# Patient Record
Sex: Male | Born: 1960 | Race: Black or African American | Hispanic: No | Marital: Single | Smoking: Current every day smoker
Health system: Southern US, Community
[De-identification: ages and names within clinical notes are randomized; demographics above are authoritative.]

---

## 2005-01-03 ENCOUNTER — Emergency Department (HOSPITAL_COMMUNITY): Admission: EM | Admit: 2005-01-03 | Discharge: 2005-01-03 | Payer: Self-pay | Admitting: Emergency Medicine

## 2008-09-09 ENCOUNTER — Emergency Department (HOSPITAL_COMMUNITY): Admission: EM | Admit: 2008-09-09 | Discharge: 2008-09-09 | Payer: Self-pay | Admitting: Emergency Medicine

## 2008-09-19 ENCOUNTER — Emergency Department (HOSPITAL_COMMUNITY): Admission: EM | Admit: 2008-09-19 | Discharge: 2008-09-19 | Payer: Self-pay | Admitting: *Deleted

## 2009-05-13 ENCOUNTER — Emergency Department (HOSPITAL_BASED_OUTPATIENT_CLINIC_OR_DEPARTMENT_OTHER): Admission: EM | Admit: 2009-05-13 | Discharge: 2009-05-13 | Payer: Self-pay | Admitting: Emergency Medicine

## 2009-05-21 ENCOUNTER — Ambulatory Visit: Payer: Self-pay | Admitting: Cardiovascular Disease

## 2009-05-22 ENCOUNTER — Inpatient Hospital Stay (HOSPITAL_COMMUNITY): Admission: EM | Admit: 2009-05-22 | Discharge: 2009-05-22 | Payer: Self-pay | Admitting: Emergency Medicine

## 2009-05-22 ENCOUNTER — Encounter (INDEPENDENT_AMBULATORY_CARE_PROVIDER_SITE_OTHER): Payer: Self-pay | Admitting: Internal Medicine

## 2009-06-25 ENCOUNTER — Emergency Department (HOSPITAL_COMMUNITY): Admission: EM | Admit: 2009-06-25 | Discharge: 2009-06-25 | Payer: Self-pay | Admitting: Emergency Medicine

## 2009-07-26 ENCOUNTER — Emergency Department (HOSPITAL_COMMUNITY): Admission: EM | Admit: 2009-07-26 | Discharge: 2009-07-26 | Payer: Self-pay | Admitting: Emergency Medicine

## 2009-07-29 ENCOUNTER — Emergency Department (HOSPITAL_COMMUNITY): Admission: EM | Admit: 2009-07-29 | Discharge: 2009-07-29 | Payer: Self-pay | Admitting: Emergency Medicine

## 2010-09-22 LAB — CBC
HCT: 48.8 % (ref 39.0–52.0)
MCV: 87.6 fL (ref 78.0–100.0)
Platelets: 225 10*3/uL (ref 150–400)
RDW: 12.8 % (ref 11.5–15.5)
WBC: 12.8 10*3/uL — ABNORMAL HIGH (ref 4.0–10.5)

## 2010-09-22 LAB — RAPID URINE DRUG SCREEN, HOSP PERFORMED
Amphetamines: NOT DETECTED
Barbiturates: NOT DETECTED
Benzodiazepines: NOT DETECTED
Cocaine: NOT DETECTED

## 2010-09-22 LAB — GLUCOSE, CAPILLARY: Glucose-Capillary: 98 mg/dL (ref 70–99)

## 2010-09-22 LAB — DIFFERENTIAL
Basophils Absolute: 0.1 10*3/uL (ref 0.0–0.1)
Basophils Relative: 1 % (ref 0–1)
Eosinophils Absolute: 0.3 10*3/uL (ref 0.0–0.7)
Eosinophils Relative: 3 % (ref 0–5)
Lymphs Abs: 3.3 10*3/uL (ref 0.7–4.0)
Neutrophils Relative %: 66 % (ref 43–77)

## 2010-09-22 LAB — POCT I-STAT, CHEM 8
Calcium, Ion: 1.15 mmol/L (ref 1.12–1.32)
Creatinine, Ser: 1 mg/dL (ref 0.4–1.5)
Glucose, Bld: 101 mg/dL — ABNORMAL HIGH (ref 70–99)
HCT: 52 % (ref 39.0–52.0)
Hemoglobin: 17.7 g/dL — ABNORMAL HIGH (ref 13.0–17.0)
Potassium: 3.4 mEq/L — ABNORMAL LOW (ref 3.5–5.1)
TCO2: 29 mmol/L (ref 0–100)

## 2010-09-22 LAB — CK TOTAL AND CKMB (NOT AT ARMC)
CK, MB: 7 ng/mL — ABNORMAL HIGH (ref 0.3–4.0)
Relative Index: 1 (ref 0.0–2.5)

## 2010-09-22 LAB — TROPONIN I: Troponin I: 0.01 ng/mL (ref 0.00–0.06)

## 2010-09-22 LAB — CARDIAC PANEL(CRET KIN+CKTOT+MB+TROPI)
CK, MB: 5 ng/mL — ABNORMAL HIGH (ref 0.3–4.0)
Total CK: 642 U/L — ABNORMAL HIGH (ref 7–232)
Troponin I: 0.02 ng/mL (ref 0.00–0.06)

## 2016-04-19 ENCOUNTER — Emergency Department (HOSPITAL_BASED_OUTPATIENT_CLINIC_OR_DEPARTMENT_OTHER)
Admission: EM | Admit: 2016-04-19 | Discharge: 2016-04-19 | Disposition: A | Discharge status: Home / Self Care | Payer: Self-pay | Attending: Emergency Medicine | Admitting: Emergency Medicine

## 2016-04-19 ENCOUNTER — Emergency Department (HOSPITAL_BASED_OUTPATIENT_CLINIC_OR_DEPARTMENT_OTHER): Payer: Self-pay

## 2016-04-19 ENCOUNTER — Encounter (HOSPITAL_BASED_OUTPATIENT_CLINIC_OR_DEPARTMENT_OTHER): Payer: Self-pay | Admitting: *Deleted

## 2016-04-19 DIAGNOSIS — N3001 Acute cystitis with hematuria: Secondary | ICD-10-CM | POA: Insufficient documentation

## 2016-04-19 DIAGNOSIS — R935 Abnormal findings on diagnostic imaging of other abdominal regions, including retroperitoneum: Secondary | ICD-10-CM | POA: Insufficient documentation

## 2016-04-19 DIAGNOSIS — F17200 Nicotine dependence, unspecified, uncomplicated: Secondary | ICD-10-CM | POA: Insufficient documentation

## 2016-04-19 DIAGNOSIS — R319 Hematuria, unspecified: Secondary | ICD-10-CM

## 2016-04-19 LAB — URINE MICROSCOPIC-ADD ON

## 2016-04-19 LAB — URINALYSIS, ROUTINE W REFLEX MICROSCOPIC
Bilirubin Urine: NEGATIVE
Glucose, UA: NEGATIVE mg/dL
Ketones, ur: NEGATIVE mg/dL
NITRITE: NEGATIVE
PH: 5.5 (ref 5.0–8.0)
Protein, ur: 30 mg/dL — AB
SPECIFIC GRAVITY, URINE: 1.02 (ref 1.005–1.030)

## 2016-04-19 MED ORDER — CIPROFLOXACIN HCL 500 MG PO TABS: 500.0000 mg | ORAL_TABLET | Freq: Two times a day (BID) | ORAL | 0 refills | Status: DC

## 2016-04-19 MED FILL — CIPROFLOXACIN HCL 500 MG TA: 500 | 7 days supply | Qty: 14 | Fill #0

## 2016-04-19 NOTE — ED Notes (Signed)
MD at bedside EMT for chaperoned exam

## 2016-04-19 NOTE — ED Notes (Signed)
Pt given specimen cup and instructions for clean catch urine sample. Will notify staff when able to void.

## 2016-04-19 NOTE — ED Provider Notes (Signed)
[facility]   CSN: [id number] Arrival date & time: 04/19/16  1053     History   Chief Complaint Chief Complaint  Patient presents with  . Hematuria    HPI Tyler Fritz is a 55 y.o. male.  HPI Patient had the same blood-tinged urine at the end of urination for the past 3 days. He reports that yesterday his urine was bloody in appearance. He denies any clots. He denies testicular pain or pain and burning with urination. Poor she has been having some aching right-sided flank and lower back pain since that symptoms started. No fevers chills nausea or vomiting. No abdominal pain. Patient reports he has had a kidney stone once before a number of years ago and had some hematuria at that time. He reports he is otherwise been well.  He smokes a half pack per day, no alcohol.  Patient is sexually active with one partner. He reports he does not feel he has risk of sexually transmitted disease. History reviewed. No pertinent past medical history.  There are no active problems to display for this patient.   History reviewed. No pertinent surgical history.     Home Medications    Prior to Admission medications   Medication Sig Start Date End Date Taking? Authorizing Provider  ciprofloxacin (CIPRO) 500 MG tablet Take 1 tablet (500 mg total) by mouth 2 (two) times daily. One po bid x 7 days 04/19/16   Arby BarretteMarcy Toby Breithaupt, MD    Family History History reviewed. No pertinent family history.  Social History Social History  Substance Use Topics  . Smoking status: Current Every Day Smoker  . Smokeless tobacco: Never Used  . Alcohol use Not on file     Allergies   Review of patient's allergies indicates no known allergies.   Review of Systems Review of Systems  10 Systems reviewed and are negative for acute change except as noted in the HPI.  Physical Exam Updated Vital Signs BP 127/96 (BP Location: Right Arm)   Pulse (!) 57   Temp 98.9 F  (37.2 C) (Oral)   Resp 18   Ht 6' (1.829 m)   Wt 186 lb (84.4 kg)   SpO2 100%   BMI 25.23 kg/m   Physical Exam  Constitutional: He appears well-developed and well-nourished.  HENT:  Head: Normocephalic and atraumatic.  Eyes: Conjunctivae are normal.  Neck: Neck supple.  Cardiovascular: Normal rate and regular rhythm.   No murmur heard. Pulmonary/Chest: Effort normal and breath sounds normal. No respiratory distress.  Abdominal: Soft. He exhibits no distension and no mass. There is no tenderness. There is no guarding.  Genitourinary: Penis normal.  Genitourinary Comments: No inguinal lymphadenopathy. Normal visual inspection of penis and scrotum. No lesions or discharge. Testicles nontender and smooth. No masses in the inguinal canals.  Musculoskeletal: He exhibits no edema or tenderness.  Neurological: He is alert.  Skin: Skin is warm and dry.  Psychiatric: He has a normal mood and affect.  Nursing note and vitals reviewed.    ED Treatments / Results  Labs (all labs ordered are listed, but only abnormal results are displayed) Labs Reviewed  URINALYSIS, ROUTINE W REFLEX MICROSCOPIC (NOT [facility]) - Abnormal; Notable for the following:       Result Value   APPearance CLOUDY (*)    Hgb urine dipstick LARGE (*)    Protein, ur 30 (*)    Leukocytes, UA SMALL (*)    All other components within normal limits  URINE MICROSCOPIC-ADD ON - Abnormal; Notable for the following:    Squamous Epithelial / LPF 0-5 (*)    Bacteria, UA MANY (*)    All other components within normal limits  URINE CULTURE  GC/CHLAMYDIA PROBE AMP ([place]) NOT AT [facility]    EKG  EKG Interpretation None       Radiology Ct Renal Stone Study  Result Date: 04/19/2016 CLINICAL DATA:  Flank pain and tenderness for 3 days. EXAM: CT ABDOMEN AND PELVIS WITHOUT CONTRAST TECHNIQUE: Multidetector CT imaging of the abdomen and pelvis was performed following the standard protocol without IV contrast.  COMPARISON:  None. FINDINGS: Lower chest: The lung bases appear clear. No pleural or pericardial effusion. Hepatobiliary: No focal liver abnormality identified. The gallbladder is normal. No biliary dilatation. Pancreas: Unremarkable. No pancreatic ductal dilatation or surrounding inflammatory changes. Spleen: Multiple areas of scarring involve the spleen, perhaps reflecting chronic infarct or prior trauma. Adrenals/Urinary Tract: The adrenal glands appear normal. There is a 1.5 cm low-attenuation structure in the right kidney, image 26 of series 2. This is incompletely characterized without IV contrast. No kidney stones or hydronephrosis identified. There is no ureteral lithiasis visualize. Diffuse bladder wall irregularity is identified. No bladder calculus identified. Stomach/Bowel: Stomach is within normal limits. Appendix appears normal. No evidence of bowel wall thickening, distention, or inflammatory changes. Vascular/Lymphatic: Calcified atherosclerotic disease involves the abdominal aorta. No aneurysm. No enlarged retroperitoneal or mesenteric adenopathy. No enlarged pelvic or inguinal lymph nodes. Reproductive: Prostate is unremarkable. Other: No abdominal wall hernia or abnormality. No abdominopelvic ascites. Musculoskeletal: L5-S1 degenerative disc disease is identified. IMPRESSION: 1. No acute findings no kidney stones or hydronephrosis identified. 2. Right renal cyst is incompletely characterized with out IV contrast material. 3. There is diffuse bladder wall irregularity, nonspecific. In the setting of hematuria consider further investigation with hematuria protocol CT including postcontrast/ delayed imaging of the kidneys, ureters and urinary bladder. 4. Aortic atherosclerosis. Electronically Signed   By: Signa Kellaylor  Stroud M.D.   On: 04/19/2016 12:07    Procedures Procedures (including critical care time)  Medications Ordered in ED Medications - No data to display   Initial Impression /  Assessment and Plan / ED Course  I have reviewed the triage vital signs and the nursing notes.  Pertinent labs & imaging results that were available during my care of the patient were reviewed by me and considered in my medical decision making (see chart for details).  Clinical Course    Final Clinical Impressions(s) / ED Diagnoses   Final diagnoses:  Hematuria, unspecified type  Acute cystitis with hematuria  Abnormal abdominal CT scan   Patient clinical well appearance. He has had intermittent hematuria. Physical examination is normal with nontender abdomen and normal genital exam. Urinalysis is positive both for blood and white cells with bacteria present. Patient will be treated for UTI. CT scan has ruled out kidney stone. Identified however is irregular contour of bladder. Patient is counseled on the importance of follow-up with urology to make sure this does not represent bladder cancer or other serious condition. New Prescriptions New Prescriptions   CIPROFLOXACIN (CIPRO) 500 MG TABLET    Take 1 tablet (500 mg total) by mouth 2 (two) times daily. One po bid x 7 days     Arby BarretteMarcy Dwain Huhn, MD 04/19/16 1248

## 2016-04-19 NOTE — ED Triage Notes (Signed)
Pt reports increasing hematuria x 3 days with some some right sided flank pain.

## 2016-04-20 LAB — URINE CULTURE: Culture: <10000 — AB

## 2016-04-20 LAB — GC/CHLAMYDIA PROBE AMP (~~LOC~~) NOT AT ARMC
Chlamydia: NEGATIVE
Neisseria Gonorrhea: NEGATIVE

## 2016-07-22 ENCOUNTER — Encounter (HOSPITAL_BASED_OUTPATIENT_CLINIC_OR_DEPARTMENT_OTHER): Payer: Self-pay | Admitting: *Deleted

## 2016-07-22 ENCOUNTER — Emergency Department (HOSPITAL_BASED_OUTPATIENT_CLINIC_OR_DEPARTMENT_OTHER)
Admission: EM | Admit: 2016-07-22 | Discharge: 2016-07-22 | Disposition: A | Discharge status: Home / Self Care | Payer: BLUE CROSS/BLUE SHIELD | Attending: Emergency Medicine | Admitting: Emergency Medicine

## 2016-07-22 DIAGNOSIS — F17210 Nicotine dependence, cigarettes, uncomplicated: Secondary | ICD-10-CM | POA: Diagnosis not present

## 2016-07-22 DIAGNOSIS — R319 Hematuria, unspecified: Secondary | ICD-10-CM

## 2016-07-22 DIAGNOSIS — M545 Low back pain, unspecified: Secondary | ICD-10-CM

## 2016-07-22 LAB — URINALYSIS, ROUTINE W REFLEX MICROSCOPIC
BILIRUBIN URINE: NEGATIVE
GLUCOSE, UA: NEGATIVE mg/dL
KETONES UR: NEGATIVE mg/dL
Nitrite: NEGATIVE
PH: 5.5 (ref 5.0–8.0)
PROTEIN: NEGATIVE mg/dL
Specific Gravity, Urine: 1.025 (ref 1.005–1.030)

## 2016-07-22 LAB — URINALYSIS, MICROSCOPIC (REFLEX)

## 2016-07-22 MED ORDER — CEPHALEXIN 500 MG PO CAPS: 500.0000 mg | ORAL_CAPSULE | Freq: Four times a day (QID) | ORAL | 0 refills | Status: DC

## 2016-07-22 MED FILL — CEPHALEXIN 500 MG CAPSULE: 500 | 7 days supply | Qty: 28 | Fill #0

## 2016-07-22 NOTE — ED Provider Notes (Signed)
[facility]   CSN: [id number] Arrival date & time: 07/22/16  4098     History   Chief Complaint Chief Complaint  Patient presents with  . Back Pain    HPI Tyler Fritz is a 56 y.o. male.  Patient is a 56 year old male with no significant past medical history. He presents for evaluation of low back pain and urinating blood. This began yesterday. He denies any fevers or chills. He denies any specific injury or trauma.  He was seen several weeks ago with similar complaints. He underwent a renal CT revealed nonspecific thickening of the bladder, concerning for inflammation, but unable to rule out neoplastic process. He was advised to follow-up with urology, however has not done this as his symptoms improved with Cipro.   The history is provided by the patient.  Back Pain   This is a recurrent problem. The current episode started yesterday. The problem occurs constantly. The problem has been gradually worsening. The pain is associated with no known injury. The pain is present in the lumbar spine. Quality: Spasm. The pain does not radiate. The pain is moderate.    History reviewed. No pertinent past medical history.  There are no active problems to display for this patient.   History reviewed. No pertinent surgical history.     Home Medications    Prior to Admission medications   Not on File    Family History No family history on file.  Social History Social History  Substance Use Topics  . Smoking status: Current Every Day Smoker    Packs/day: 0.50    Types: Cigarettes  . Smokeless tobacco: Never Used  . Alcohol use Not on file     Allergies   Patient has no known allergies.   Review of Systems Review of Systems  Musculoskeletal: Positive for back pain.  All other systems reviewed and are negative.    Physical Exam Updated Vital Signs BP 114/84 (BP Location: Left Arm)   Pulse (!) 56   Temp 98.3 F (36.8 C) (Oral)    Resp 18   Ht 6' (1.829 m)   Wt 180 lb (81.6 kg)   SpO2 100%   BMI 24.41 kg/m   Physical Exam  Constitutional: He is oriented to person, place, and time. He appears well-developed and well-nourished. No distress.  HENT:  Head: Normocephalic and atraumatic.  Mouth/Throat: Oropharynx is clear and moist.  Neck: Normal range of motion. Neck supple.  Cardiovascular: Normal rate and regular rhythm.  Exam reveals no friction rub.   No murmur heard. Pulmonary/Chest: Effort normal and breath sounds normal. No respiratory distress. He has no wheezes. He has no rales.  Abdominal: Soft. Bowel sounds are normal. He exhibits no distension. There is no tenderness.  Musculoskeletal: Normal range of motion. He exhibits no edema.  Neurological: He is alert and oriented to person, place, and time. Coordination normal.  Skin: Skin is warm and dry. He is not diaphoretic.  Nursing note and vitals reviewed.    ED Treatments / Results  Labs (all labs ordered are listed, but only abnormal results are displayed) Labs Reviewed  URINALYSIS, ROUTINE W REFLEX MICROSCOPIC    EKG  EKG Interpretation None       Radiology No results found.  Procedures Procedures (including critical care time)  Medications Ordered in ED Medications - No data to display   Initial Impression / Assessment and Plan / ED Course  I have reviewed the triage vital signs and the  nursing notes.  Pertinent labs & imaging results that were available during my care of the patient were reviewed by me and considered in my medical decision making (see chart for details).  Urinalysis reveals hematuria, however no obvious infection. As Cipro improved his symptoms before, he will be treated with another antibiotic, this time Keflex. I have reviewed his CT scan from his prior visit and feel as though he will require follow-up with urology. I explained to him the importance of follow-up and the possible implications of the findings on  his previous CT.  Final Clinical Impressions(s) / ED Diagnoses   Final diagnoses:  None    New Prescriptions New Prescriptions   No medications on file     Geoffery Lyonsouglas Sheritta Deeg, MD 07/22/16 1042

## 2016-07-22 NOTE — Discharge Instructions (Signed)
Keflex as prescribed.  Call [facility] this afternoon to arrange a follow-up appointment. Their contact information has been provided in this discharge summary.  Return to the ER if symptoms significantly worsen or change.

## 2016-07-22 NOTE — ED Notes (Signed)
ED Provider at bedside. 

## 2016-07-22 NOTE — ED Notes (Signed)
No urine sample at this time

## 2016-07-22 NOTE — ED Triage Notes (Signed)
C/o fall down steps 3 months ago. C/o blood in urine and bil back spasms. Was told to see urologist and did not see them.

## 2016-10-26 ENCOUNTER — Emergency Department (HOSPITAL_BASED_OUTPATIENT_CLINIC_OR_DEPARTMENT_OTHER)
Admission: EM | Admit: 2016-10-26 | Discharge: 2016-10-26 | Disposition: A | Discharge status: Home / Self Care | Payer: Self-pay | Attending: Emergency Medicine | Admitting: Emergency Medicine

## 2016-10-26 ENCOUNTER — Encounter (HOSPITAL_BASED_OUTPATIENT_CLINIC_OR_DEPARTMENT_OTHER): Payer: Self-pay | Admitting: *Deleted

## 2016-10-26 ENCOUNTER — Emergency Department (HOSPITAL_BASED_OUTPATIENT_CLINIC_OR_DEPARTMENT_OTHER): Payer: Self-pay

## 2016-10-26 DIAGNOSIS — F17210 Nicotine dependence, cigarettes, uncomplicated: Secondary | ICD-10-CM | POA: Insufficient documentation

## 2016-10-26 DIAGNOSIS — J020 Streptococcal pharyngitis: Secondary | ICD-10-CM | POA: Insufficient documentation

## 2016-10-26 DIAGNOSIS — R319 Hematuria, unspecified: Secondary | ICD-10-CM | POA: Insufficient documentation

## 2016-10-26 LAB — URINALYSIS, MICROSCOPIC (REFLEX)

## 2016-10-26 LAB — BASIC METABOLIC PANEL
Anion gap: 7 (ref 5–15)
BUN: 15 mg/dL (ref 6–20)
CALCIUM: 8.3 mg/dL — AB (ref 8.9–10.3)
CO2: 26 mmol/L (ref 22–32)
CREATININE: 1.05 mg/dL (ref 0.61–1.24)
Chloride: 104 mmol/L (ref 101–111)
GFR calc Af Amer: >60 mL/min (ref >60)
Glucose, Bld: 99 mg/dL (ref 65–99)
POTASSIUM: 3.6 mmol/L (ref 3.5–5.1)
SODIUM: 137 mmol/L (ref 135–145)

## 2016-10-26 LAB — URINALYSIS, ROUTINE W REFLEX MICROSCOPIC

## 2016-10-26 LAB — CBC WITH DIFFERENTIAL/PLATELET
BASOS ABS: 0 10*3/uL (ref 0.0–0.1)
Basophils Relative: 0 %
EOS ABS: 0.3 10*3/uL (ref 0.0–0.7)
EOS PCT: 2 %
HCT: 42.1 % (ref 39.0–52.0)
Hemoglobin: 14.2 g/dL (ref 13.0–17.0)
Lymphocytes Relative: 17 %
Lymphs Abs: 2.8 10*3/uL (ref 0.7–4.0)
MCH: 28.6 pg (ref 26.0–34.0)
MCHC: 33.7 g/dL (ref 30.0–36.0)
MCV: 84.7 fL (ref 78.0–100.0)
Monocytes Absolute: 1.3 10*3/uL — ABNORMAL HIGH (ref 0.1–1.0)
Monocytes Relative: 8 %
Neutro Abs: 11.7 10*3/uL — ABNORMAL HIGH (ref 1.7–7.7)
Neutrophils Relative %: 73 %
PLATELETS: 320 10*3/uL (ref 150–400)
RBC: 4.97 MIL/uL (ref 4.22–5.81)
RDW: 13.5 % (ref 11.5–15.5)
WBC: 16.2 10*3/uL — AB (ref 4.0–10.5)

## 2016-10-26 LAB — RAPID STREP SCREEN (MED CTR MEBANE ONLY): Streptococcus, Group A Screen (Direct): POSITIVE — AB

## 2016-10-26 MED ORDER — ACETAMINOPHEN 500 MG PO TABS
1000.0000 mg | ORAL_TABLET | Freq: Once | ORAL | Status: AC
  Administered 2016-10-26: 1000 mg via ORAL
  Filled 2016-10-26: qty 2

## 2016-10-26 MED ORDER — DEXTROSE 5 % IV SOLN
1.0000 g | Freq: Once | INTRAVENOUS | Status: AC
  Administered 2016-10-26: 1 g via INTRAVENOUS
  Filled 2016-10-26: qty 10

## 2016-10-26 MED ORDER — PENICILLIN G BENZATHINE 1200000 UNIT/2ML IM SUSP
1.2000 10*6.[IU] | Freq: Once | INTRAMUSCULAR | Status: AC
  Administered 2016-10-26: 1.2 10*6.[IU] via INTRAMUSCULAR
  Filled 2016-10-26: qty 2

## 2016-10-26 MED ORDER — DEXAMETHASONE SODIUM PHOSPHATE 10 MG/ML IJ SOLN
10.0000 mg | Freq: Once | INTRAMUSCULAR | Status: AC
  Administered 2016-10-26: 10 mg via INTRAMUSCULAR
  Filled 2016-10-26: qty 1

## 2016-10-26 MED ORDER — CEPHALEXIN 500 MG PO CAPS: 500.0000 mg | ORAL_CAPSULE | Freq: Two times a day (BID) | ORAL | 0 refills | Status: AC

## 2016-10-26 NOTE — ED Notes (Signed)
ED Provider at bedside. 

## 2016-10-26 NOTE — ED Notes (Signed)
Patient claimed that he fell on the stairs at their house  6 months ago with his daughter in his arm.  He tried to be the one to get hurt than his daughter.  His butt hits the stairs and a month after, her has the bloody urine.

## 2016-10-26 NOTE — ED Provider Notes (Signed)
[facility] Provider Note   CSN: [id number] Arrival date & time: 10/26/16  1658   By signing my name below, I, Teofilo Pod, attest that this documentation has been prepared under the direction and in the presence of Azucena Kuba, PA-C. Electronically Signed: Teofilo Pod, ED Scribe. 10/26/2016. 6:03 PM.   History   Chief Complaint Chief Complaint  Patient presents with  . Otalgia  . Sore Throat    The history is provided by the patient. No language interpreter was used.   HPI Comments:  Tyler Fritz is a 56 y.o. male with PMHx of UTI and kidney stones who presents to the Emergency Department complaining Of multiple complaints including left ear pain, sore throat, hematuria.  Pt reports recurrent hematuria x 1 week, which he states started as just a few drops of blood, and is now "pure blood." He also states that 2 days ago he noticed blood clots in his urine. Denies pain with urination, fever, abdominal pain, testicular pain/swelling. Reports left ear pain and sore throat that is progressively worsening over the past 2-3 days. Difficulty to swallow due to the pain. Has been trying over-the-counter medications including Motrin, Tylenol, allergy medication without any relief. associated chills and subjective fever. Denies any lightheadedness, dizziness, headache, vision changes, cough, chest pain, shortness of breath, abdominal pain, nausea, emesis, change in bowel habits. The patient states that he has not been sexually active for the past 3-4 years. Does not have any concern for STD.  History reviewed. No pertinent past medical history.  There are no active problems to display for this patient.   History reviewed. No pertinent surgical history.     Home Medications    Prior to Admission medications   Medication Sig Start Date End Date Taking? Authorizing Provider  cephALEXin (KEFLEX) 500 MG capsule Take 1 capsule (500 mg total) by mouth 4 (four)  times daily. 07/22/16   Geoffery Lyons, MD    Family History No family history on file.  Social History Social History  Substance Use Topics  . Smoking status: Current Every Day Smoker    Packs/day: 0.50    Types: Cigarettes  . Smokeless tobacco: Never Used  . Alcohol use No     Allergies   Patient has no known allergies.   Review of Systems Review of Systems  Constitutional: Negative for fever.  HENT: Positive for ear pain and sore throat.   Gastrointestinal: Negative for abdominal pain, diarrhea, nausea and vomiting.  Genitourinary: Positive for hematuria. Negative for dysuria, scrotal swelling and testicular pain.  Neurological: Negative for dizziness, syncope, weakness, light-headedness, numbness and headaches.     Physical Exam Updated Vital Signs BP (!) 160/111 (BP Location: Left Arm)   Pulse 60   Temp 99.9 F (37.7 C) (Oral)   Resp 12   Ht 6' (1.829 m)   Wt 174 lb (78.9 kg)   SpO2 100%   BMI 23.60 kg/m   Physical Exam  Constitutional: He is oriented to person, place, and time. He appears well-developed and well-nourished. No distress.  HENT:  Head: Normocephalic and atraumatic.  Right Ear: Tympanic membrane, external ear and ear canal normal. Tympanic membrane is not erythematous and not bulging.  Left Ear: Tympanic membrane, external ear and ear canal normal. Tympanic membrane is not erythematous and not bulging.  Nose: Nose normal.  Mouth/Throat: Uvula is midline and mucous membranes are normal. No trismus in the jaw. No uvula swelling. Oropharyngeal exudate and posterior oropharyngeal edema present. No  tonsillar abscesses. Tonsils are 3+ on the right. Tonsils are 3+ on the left. No tonsillar exudate.  Eyes: Conjunctivae are normal.  Neck: Normal range of motion. Neck supple.  Cardiovascular: Normal rate, regular rhythm, normal heart sounds and intact distal pulses.   Pulmonary/Chest: Effort normal and breath sounds normal.  Abdominal: Soft. Bowel sounds  are normal. He exhibits no distension. There is no tenderness. There is no rigidity, no rebound, no guarding and no CVA tenderness.  Genitourinary: Testes normal and penis normal. Right testis shows no mass, no swelling and no tenderness. Left testis shows no mass, no swelling and no tenderness. No penile tenderness. No discharge found.  Genitourinary Comments: Chaperone present for exam.  Lymphadenopathy:    He has cervical adenopathy. No inguinal adenopathy noted on the right or left side.  Neurological: He is alert and oriented to person, place, and time.  Skin: Skin is warm and dry. Capillary refill takes less than 2 seconds.  Psychiatric: He has a normal mood and affect.  Nursing note and vitals reviewed.    ED Treatments / Results  DIAGNOSTIC STUDIES:  Oxygen Saturation is 100% on RA, normal by my interpretation.    COORDINATION OF CARE:  5:54 PM Discussed treatment plan with pt at bedside and pt agreed to plan.   Labs (all labs ordered are listed, but only abnormal results are displayed) Labs Reviewed  RAPID STREP SCREEN (NOT AT [facility]) - Abnormal; Notable for the following:       Result Value   Streptococcus, Group A Screen (Direct) POSITIVE (*)    All other components within normal limits  URINALYSIS, ROUTINE W REFLEX MICROSCOPIC - Abnormal; Notable for the following:    Color, Urine RED (*)    APPearance TURBID (*)    Glucose, UA   (*)    Value: TEST NOT REPORTED DUE TO COLOR INTERFERENCE OF URINE PIGMENT   Hgb urine dipstick   (*)    Value: TEST NOT REPORTED DUE TO COLOR INTERFERENCE OF URINE PIGMENT   Bilirubin Urine   (*)    Value: TEST NOT REPORTED DUE TO COLOR INTERFERENCE OF URINE PIGMENT   Ketones, ur   (*)    Value: TEST NOT REPORTED DUE TO COLOR INTERFERENCE OF URINE PIGMENT   Protein, ur   (*)    Value: TEST NOT REPORTED DUE TO COLOR INTERFERENCE OF URINE PIGMENT   Nitrite   (*)    Value: TEST NOT REPORTED DUE TO COLOR INTERFERENCE OF URINE PIGMENT    Leukocytes, UA   (*)    Value: TEST NOT REPORTED DUE TO COLOR INTERFERENCE OF URINE PIGMENT   All other components within normal limits  URINALYSIS, MICROSCOPIC (REFLEX) - Abnormal; Notable for the following:    Bacteria, UA RARE (*)    Squamous Epithelial / LPF 0-5 (*)    All other components within normal limits  BASIC METABOLIC PANEL - Abnormal; Notable for the following:    Calcium 8.3 (*)    All other components within normal limits  CBC WITH DIFFERENTIAL/PLATELET - Abnormal; Notable for the following:    WBC 16.2 (*)    Neutro Abs 11.7 (*)    Monocytes Absolute 1.3 (*)    All other components within normal limits  URINE CULTURE    EKG  EKG Interpretation None       Radiology Ct Renal Stone Study  Result Date: 10/26/2016 CLINICAL DATA:  56 year old male post fall 6 months ago. Hematuria off and on since. History  of kidney stones. Initial encounter. EXAM: CT ABDOMEN AND PELVIS WITHOUT CONTRAST TECHNIQUE: Multidetector CT imaging of the abdomen and pelvis was performed following the standard protocol without IV contrast. COMPARISON:  04/19/2016 CT. FINDINGS: Lower chest: Scarring/subsegmental atelectasis lung bases. Heart size within normal limits. Hepatobiliary: Taking into account limitation by non contrast imaging, no worrisome hepatic lesion. No calcified gallstone. Pancreas: Taking into account limitation by non contrast imaging, no mass or inflammation. Spleen: Taking into account limitation by non contrast imaging, no mass or enlargement. Atypical contour unchanged. Adrenals/Urinary Tract: No renal or ureteral obstructing stone or evidence hydronephrosis. Right renal 1.6 cm cyst. Taking into account limitation by non contrast imaging, no obvious worrisome renal or adrenal mass. 1.2 cm lobulated lesion within the bladder base. Question primary bladder mass. Result of prostate gland enlargement felt to be a secondary consideration. Stomach/Bowel: No extraluminal bowel inflammatory  process, free fluid or free air. Evaluation of bowel is limited secondary to underdistention. Circumferential thickening rectosigmoid region may be related to underdistention rather than inflammation. Vascular/Lymphatic: Atherosclerotic changes aorta and iliac arteries with minimal ectasia without aneurysm. No adenopathy. Reproductive: Prostate gland slightly prominent and minimally lobular. Clinical and laboratory correlation recommended. Other: No bowel containing hernia. Musculoskeletal: Mild L4-5 and moderate to marked L5-S1 disc degeneration with bulge/gas containing protrusion L5-S1 level greater to left. IMPRESSION: No renal or ureteral obstructing stone or evidence hydronephrosis. Right renal 1.6 cm cyst. 1.2 cm lobulated lesion within the bladder base. Question primary bladder mass. Result of prostate gland enlargement felt to be a secondary consideration. Prostate gland slightly prominent and minimally lobular. Clinical and laboratory correlation recommended. Aortic atherosclerosis. Evaluation of bowel is limited secondary to underdistention. Circumferential thickening rectosigmoid region may be related to underdistention rather than inflammation. Moderate to marked L5-S1 disc degeneration with bulge/gas containing protrusion greater to left. Electronically Signed   By: Lacy Duverney M.D.   On: 10/26/2016 18:41    Procedures Procedures (including critical care time)  Medications Ordered in ED Medications  dexamethasone (DECADRON) injection 10 mg (10 mg Intramuscular Given 10/26/16 1829)  penicillin g benzathine (BICILLIN LA) 1200000 UNIT/2ML injection 1.2 Million Units (1.2 Million Units Intramuscular Given 10/26/16 1826)  cefTRIAXone (ROCEPHIN) 1 g in dextrose 5 % 50 mL IVPB (0 g Intravenous Stopped 10/26/16 1932)  acetaminophen (TYLENOL) tablet 1,000 mg (1,000 mg Oral Given 10/26/16 1932)     Initial Impression / Assessment and Plan / ED Course  I have reviewed the triage vital signs and the  nursing notes.  Pertinent labs & imaging results that were available during my care of the patient were reviewed by me and considered in my medical decision making (see chart for details).     The patient presents to the emergency Department today with complaints of sore throat, left ear pain, hematuria. Patient with history of hematuria that has resolved with antibiotics. He has not followed up with urology.   Pt febrile with tonsillar exudate, cervical lymphadenopathy, & dysphagia; diagnosis of strep. Treated in the Ed with steroids, NSAIDs, Pain medication and PCN IM.  Pt appears mildly dehydrated, discussed importance of water rehydration. Presentation non concerning for PTA or infxn spread to soft tissue. No trismus or uvula deviation. Specific return precautions discussed. Pt able to drink water in ED without difficulty with intact air way.   Patient with gross hematuria noted on UA. Moderate amount of WBCs, rare bacteria and squamous epithelium. Urine culture was sent. Will be treated for urinary tract infection. However have concern for possible  cancerous etiologies. CT performed to rule out kidney stone. Does show 1.2 cm lobulated mass in the bladder with secondary prostate gland enlargement. Other CT findings discussed with patient. Patient has no abdominal pain or back pain feel that these findings are not acute in nature. Patient with mild leukocytosis likely due to the strep pharyngitis. Kidney function is normal. Hemoglobin is stable. Spoke with Dr. Kathrynn Running with [facility] concerning patient's findings. Patient will be seen in the outpatient setting next week. He does not recommend any further interventions at this time.   The patient remains hemodynamically stable and in no acute distress. Pt is hemodynamically stable, in NAD, & able to ambulate in the ED. Pain has been managed & has no complaints prior to dc. Pt is comfortable with above plan and is stable for discharge at this  time. All questions were answered prior to disposition. Strict return precautions for f/u to the ED were discussed. Encouraged to follow up PCP and urologist. Patient was discussed with Dr. Jacqulyn Bath who is agreeable to above plan.   Final Clinical Impressions(s) / ED Diagnoses   Final diagnoses:  Hematuria, unspecified type  Strep pharyngitis    New Prescriptions Discharge Medication List as of 10/26/2016  7:25 PM    I personally performed the services described in this documentation, which was scribed in my presence. The recorded information has been reviewed and is accurate.     Rise Mu, PA-C 10/27/16 0231    Maia Plan, MD 10/27/16 [phone]

## 2016-10-26 NOTE — ED Triage Notes (Signed)
Sore throat and left ear pain. He also has blood in his urine which is not new for him per pt.

## 2016-10-26 NOTE — Discharge Instructions (Signed)
He did have strep throat. Have been treated in the emergency department. Make take Motrin and Tylenol at home for pain and fevers. Warm salt water gargles. If you are unable to swallow without difficulties breathing or have worsening swelling that she return to the emergency department. Your urine shows possible signs of infection. Having given antibiotic for urinary tract infection. CT scan does show possible signs of mass in your bladder. It is important that he follow up with urology this week. Return if you develop any difficulties urinating, worsening fevers, worsening pain or for any reason.

## 2016-10-28 LAB — URINE CULTURE: Culture: NO GROWTH

## 2016-11-04 ENCOUNTER — Other Ambulatory Visit: Payer: Self-pay | Admitting: Urology

## 2016-11-25 ENCOUNTER — Ambulatory Visit: Admit: 2016-11-25 | Payer: Self-pay | Admitting: Urology

## 2016-11-25 SURGERY — TURBT (TRANSURETHRAL RESECTION OF BLADDER TUMOR)
Anesthesia: General

## 2017-06-16 IMAGING — CT CT RENAL STONE PROTOCOL
2 of 4 series · 16 of 46 positions shown, 18 images · non-contrast
Comparison: None.

CLINICAL DATA: Flank pain and tenderness for 3 days.

EXAM:
CT ABDOMEN AND PELVIS WITHOUT CONTRAST
TECHNIQUE: Multidetector CT imaging of the abdomen and pelvis was performed
following the standard protocol without IV contrast.

[Series 2: axial st · axial · 0.79mm/px · z∈[-638,-233]mm · 13 of 89 slices shown, 15 images]
[im 4/89  soft-tissue]
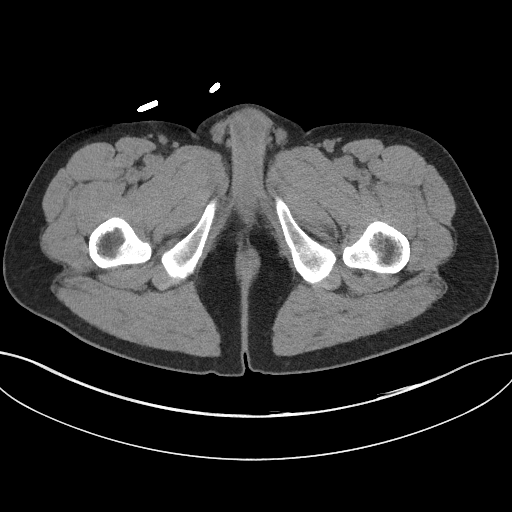
[im 4/89  bone]
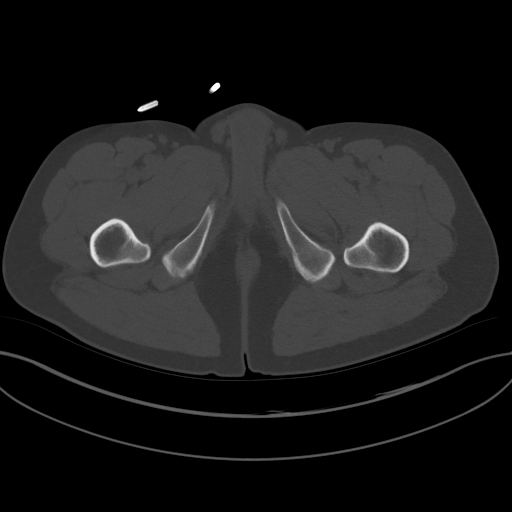
[im 11/89  soft-tissue]
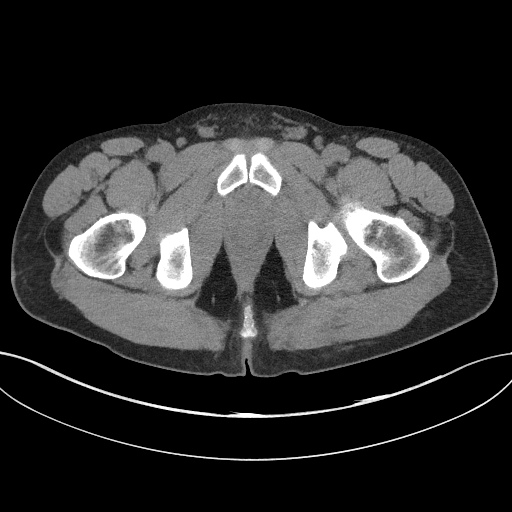
[im 18/89  soft-tissue]
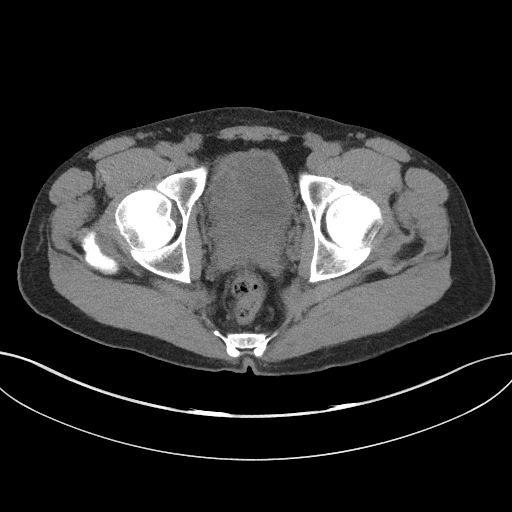
[im 25/89  soft-tissue]
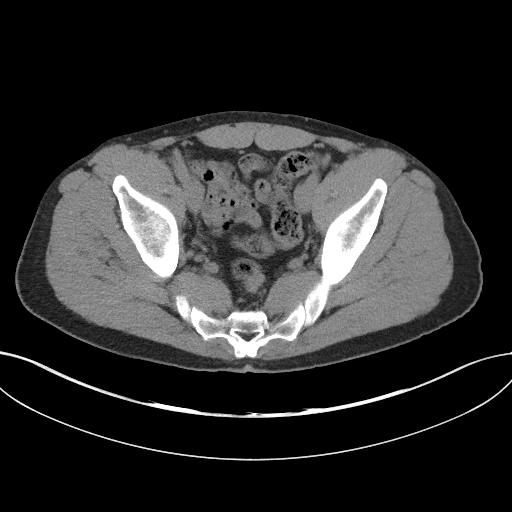
[im 32/89  soft-tissue]
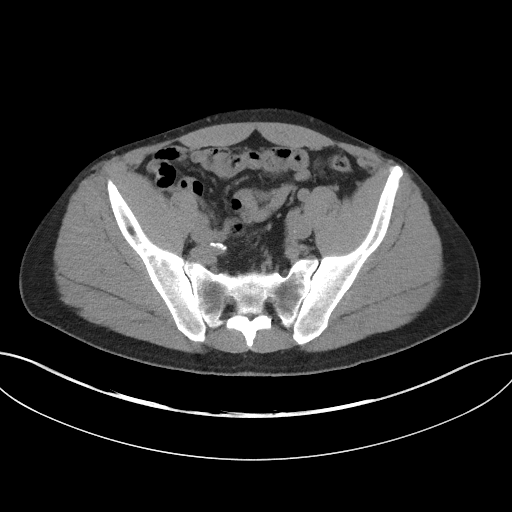
[im 39/89  soft-tissue]
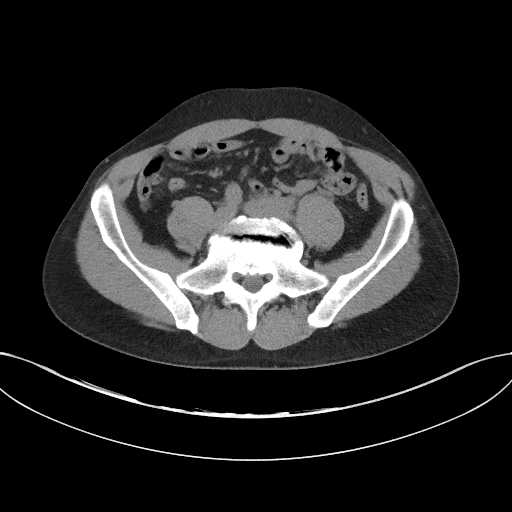
[im 46/89  soft-tissue]
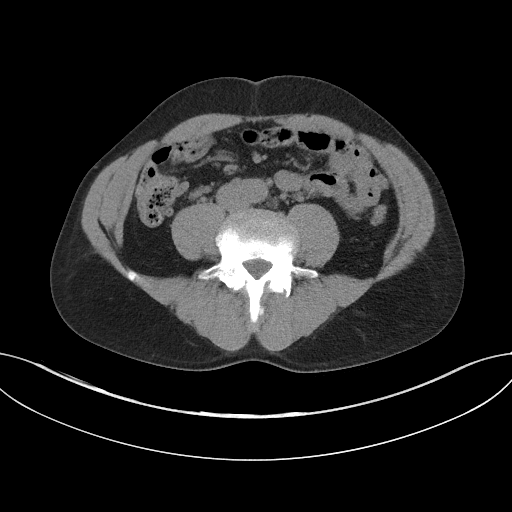
[im 50/89  soft-tissue]
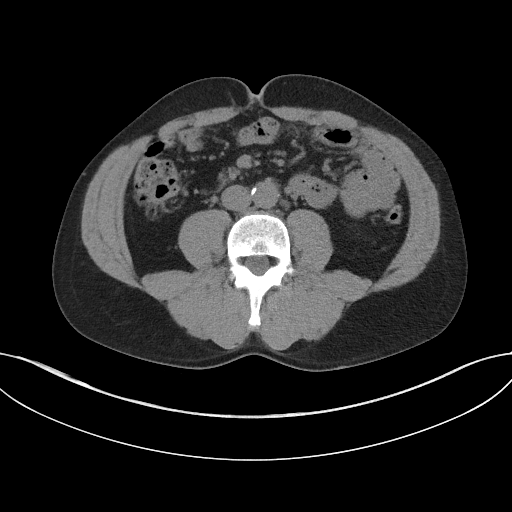
[im 57/89  soft-tissue]
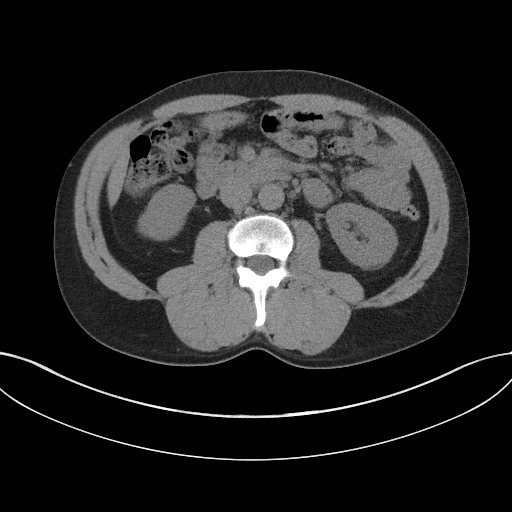
[im 57/89  bone]
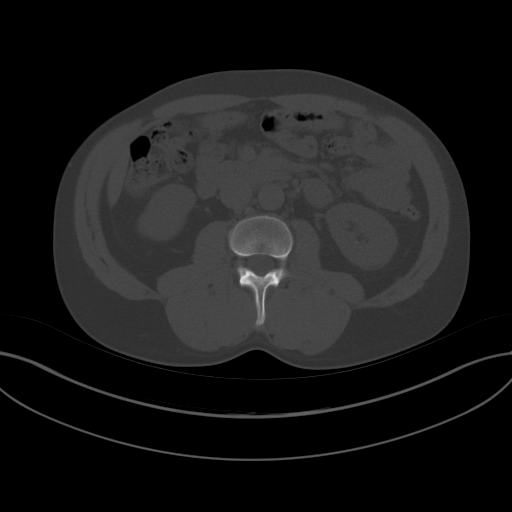
[im 64/89  soft-tissue]
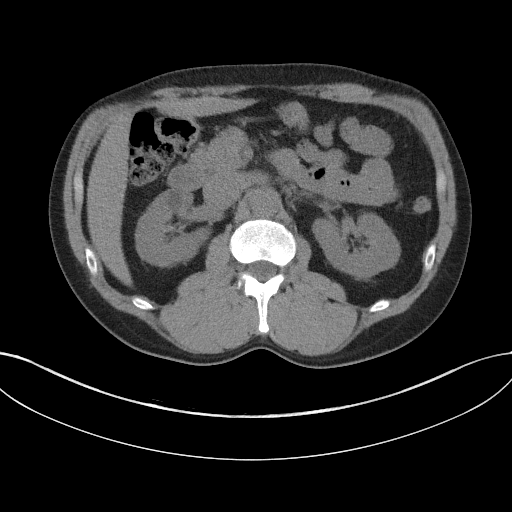
[im 71/89  soft-tissue]
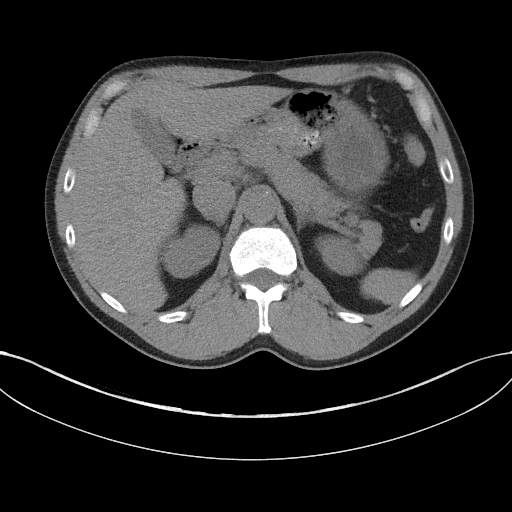
[im 78/89  soft-tissue]
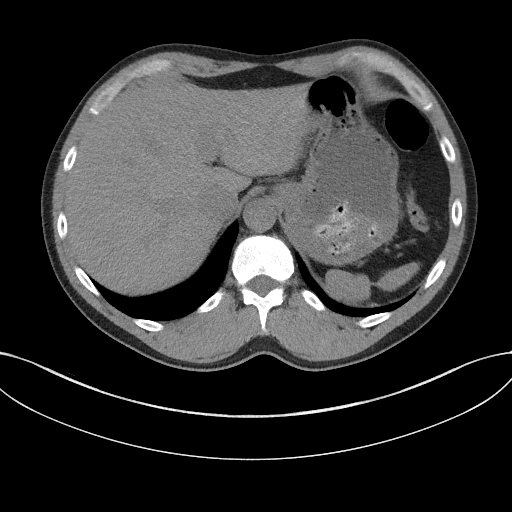
[im 85/89  soft-tissue]
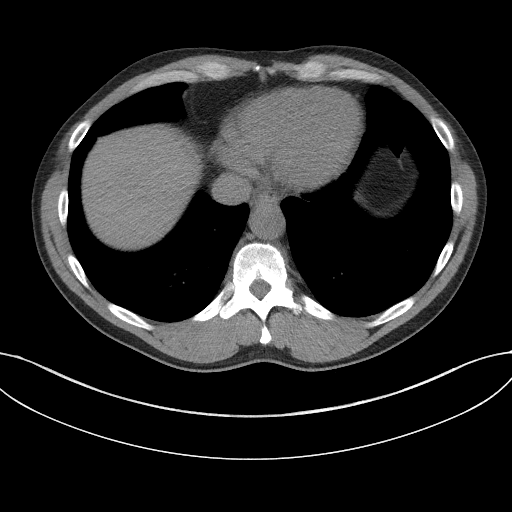

[Series 5: coronal st · coronal · 0.79mm/px · 3 of 106 slices shown]
[im 36/106  soft-tissue]
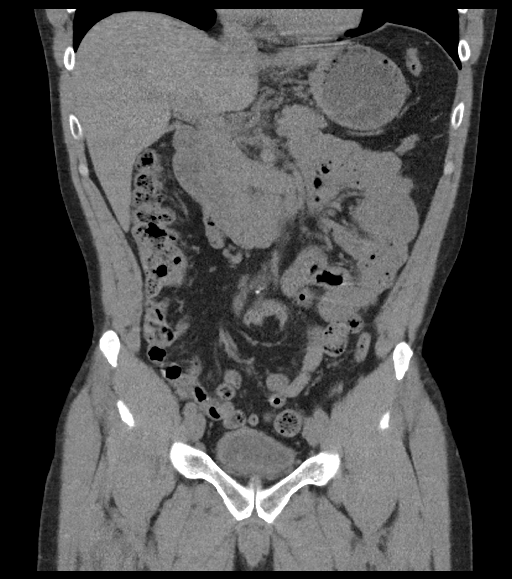
[im 47/106  soft-tissue]
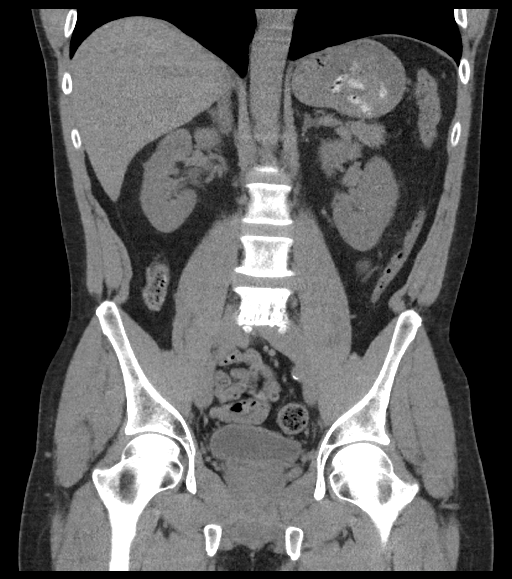
[im 59/106  soft-tissue]
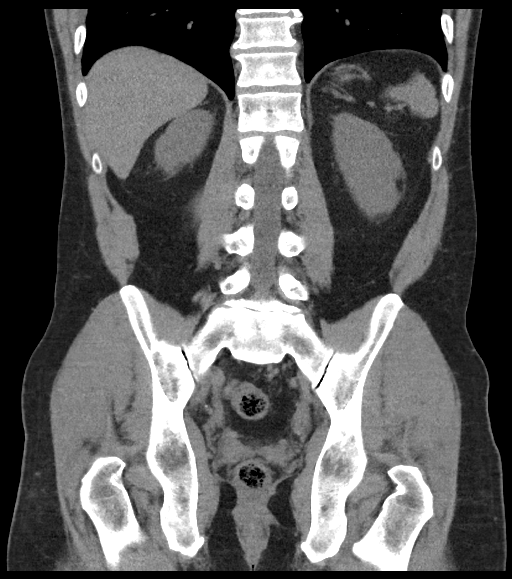

[16 of 46 positions shown; findings below may reference images not displayed]

FINDINGS: Lower chest: The lung bases appear clear. No pleural or pericardial
effusion.

Hepatobiliary: No focal liver abnormality identified. The
gallbladder is normal. No biliary dilatation.

Pancreas: Unremarkable. No pancreatic ductal dilatation or
surrounding inflammatory changes.

Spleen: Multiple areas of scarring involve the spleen, perhaps
reflecting chronic infarct or prior trauma.

Adrenals/Urinary Tract: The adrenal glands appear normal. There is a
1.5 cm low-attenuation structure in the right kidney, image 26 of
series 2. This is incompletely characterized without IV contrast. No
kidney stones or hydronephrosis identified. There is no ureteral
lithiasis visualize. Diffuse bladder wall irregularity is
identified. No bladder calculus identified.

Stomach/Bowel: Stomach is within normal limits. Appendix appears
normal. No evidence of bowel wall thickening, distention, or
inflammatory changes.

Vascular/Lymphatic: Calcified atherosclerotic disease involves the
abdominal aorta. No aneurysm. No enlarged retroperitoneal or
mesenteric adenopathy. No enlarged pelvic or inguinal lymph nodes.

Reproductive: Prostate is unremarkable.

Other: No abdominal wall hernia or abnormality. No abdominopelvic
ascites.

Musculoskeletal: L5-S1 degenerative disc disease is identified.
IMPRESSION: 1. No acute findings no kidney stones or hydronephrosis identified.
2. Right renal cyst is incompletely characterized with out IV
contrast material.
3. There is diffuse bladder wall irregularity, nonspecific. In the
setting of hematuria consider further investigation with hematuria
protocol CT including postcontrast/ delayed imaging of the kidneys,
ureters and urinary bladder.
4. Aortic atherosclerosis.

## 2017-12-23 IMAGING — CT CT RENAL STONE PROTOCOL
2 of 4 series · 15 of 46 positions shown, 17 images · non-contrast
Comparison: 04/19/2016 CT.

CLINICAL DATA: 56-year-old male post fall 6 months ago. Hematuria
off and on since. History of kidney stones. Initial encounter.

EXAM:
CT ABDOMEN AND PELVIS WITHOUT CONTRAST
TECHNIQUE: Multidetector CT imaging of the abdomen and pelvis was performed
following the standard protocol without IV contrast.

[Series 2: axial st · axial · 0.84mm/px · z∈[-511,-71]mm · 12 of 97 slices shown, 14 images]
[im 5/97  soft-tissue]
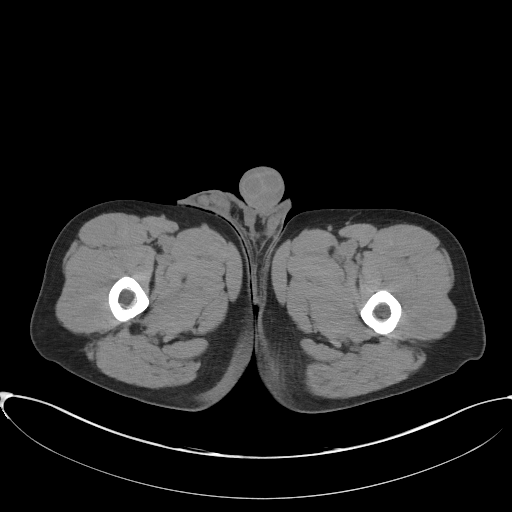
[im 5/97  bone]
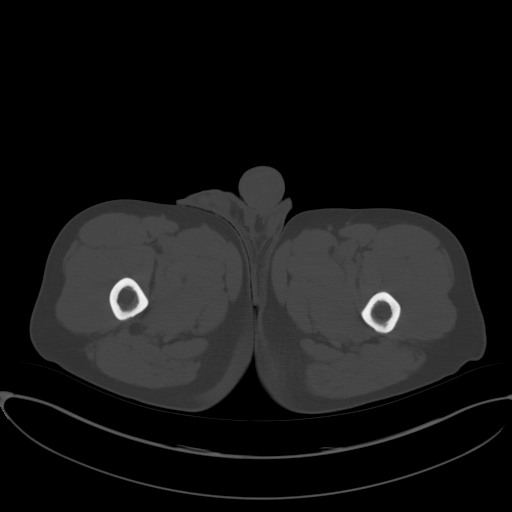
[im 13/97  soft-tissue]
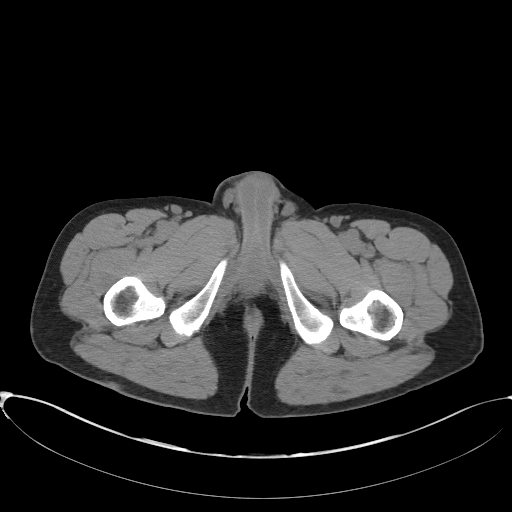
[im 21/97  soft-tissue]
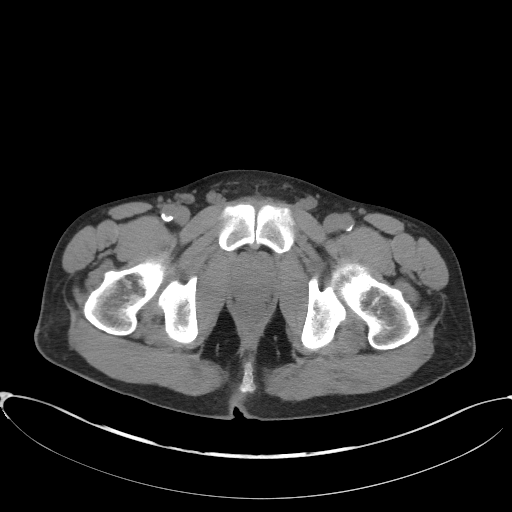
[im 29/97  soft-tissue]
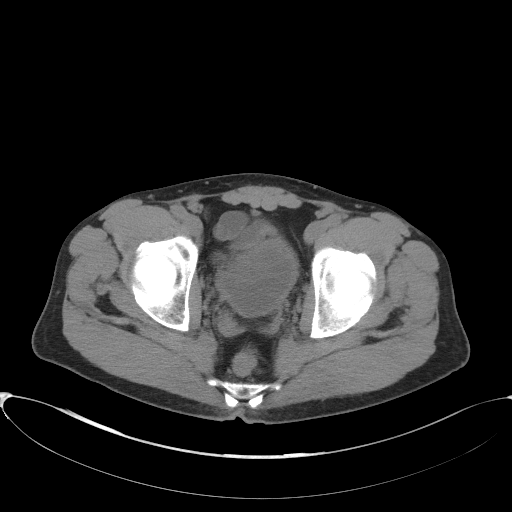
[im 37/97  soft-tissue]
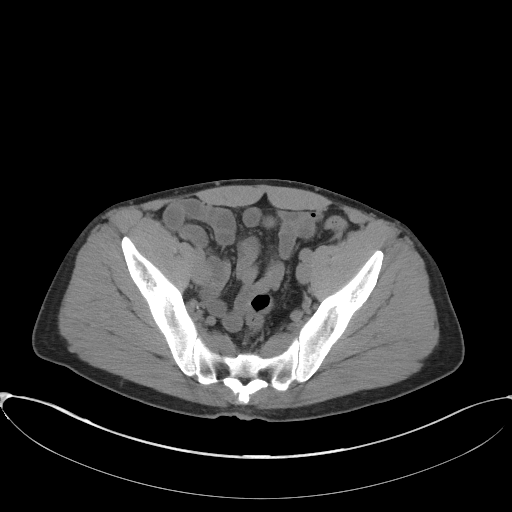
[im 45/97  soft-tissue]
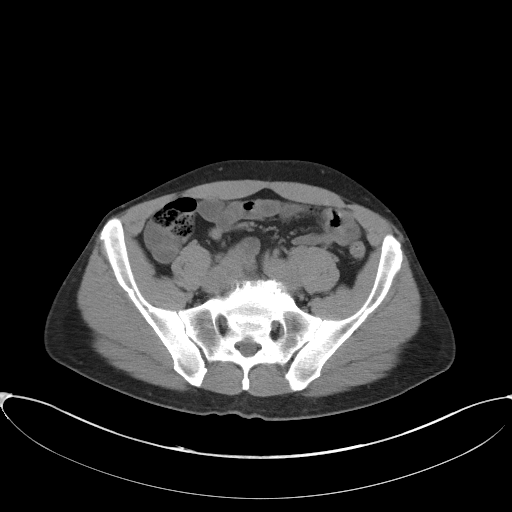
[im 53/97  soft-tissue]
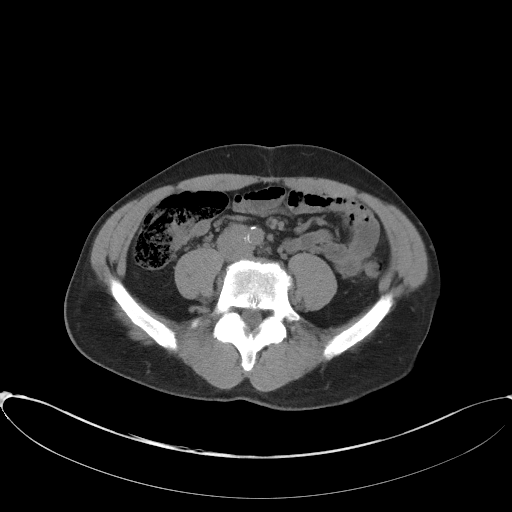
[im 61/97  soft-tissue]
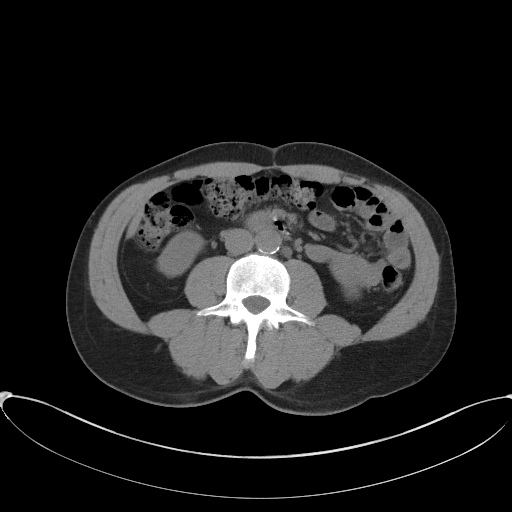
[im 69/97  soft-tissue]
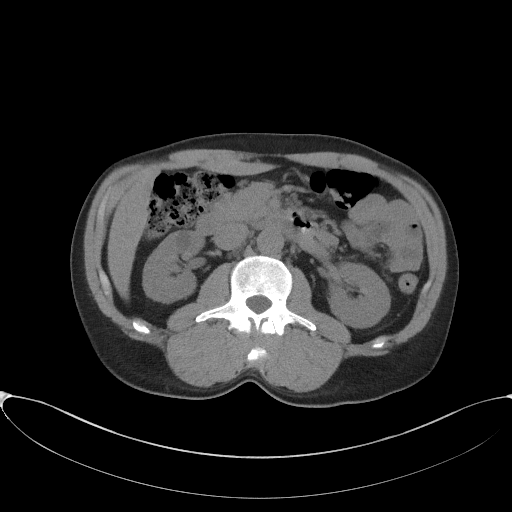
[im 69/97  bone]
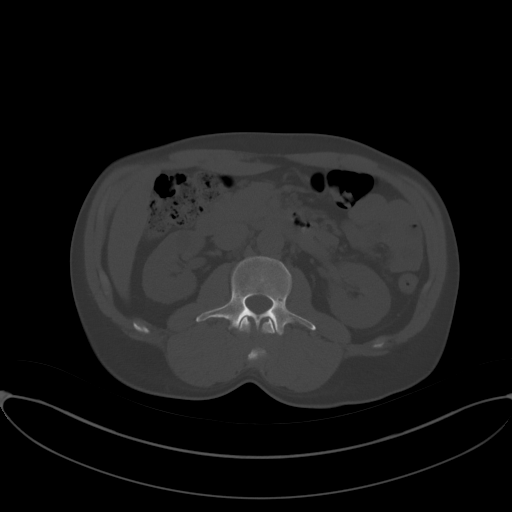
[im 77/97  soft-tissue]
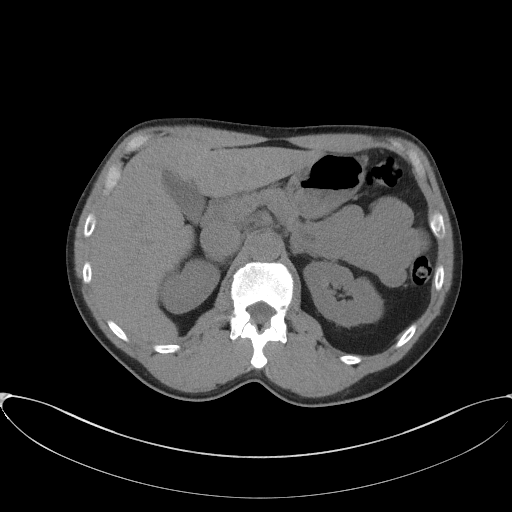
[im 85/97  soft-tissue]
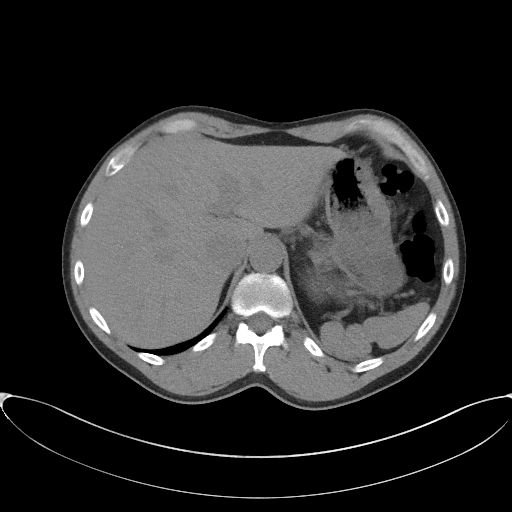
[im 93/97  soft-tissue]
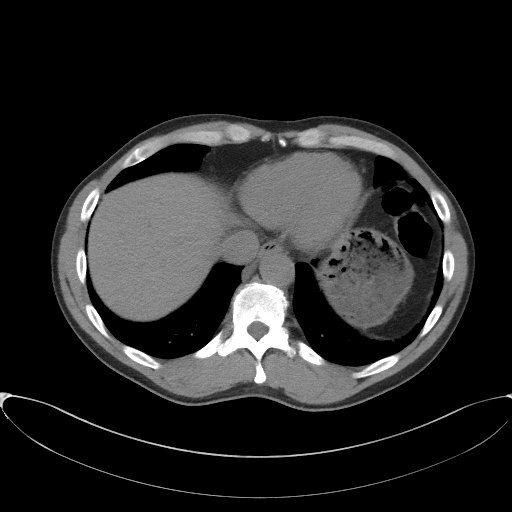

[Series 4: coronal st · coronal · 0.82mm/px · 3 of 86 slices shown]
[im 29/86  soft-tissue]
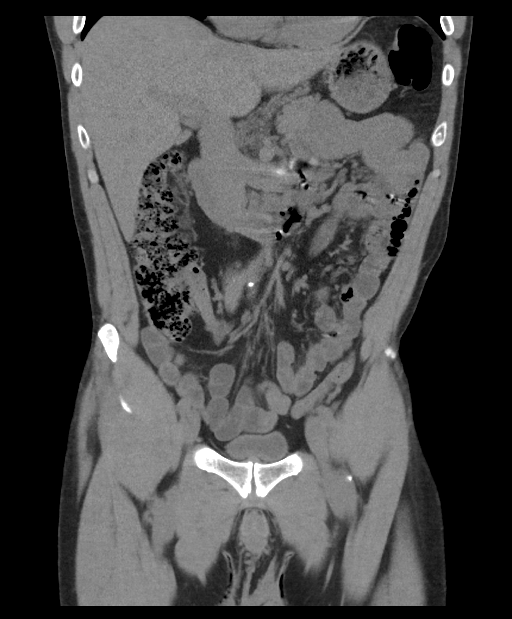
[im 38/86  soft-tissue]
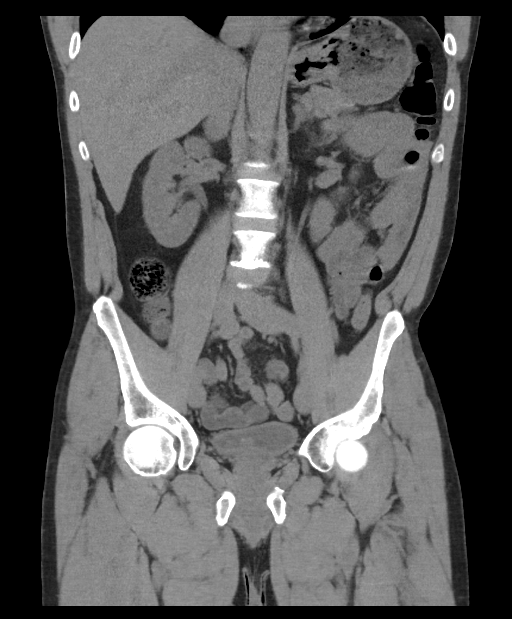
[im 48/86  soft-tissue]
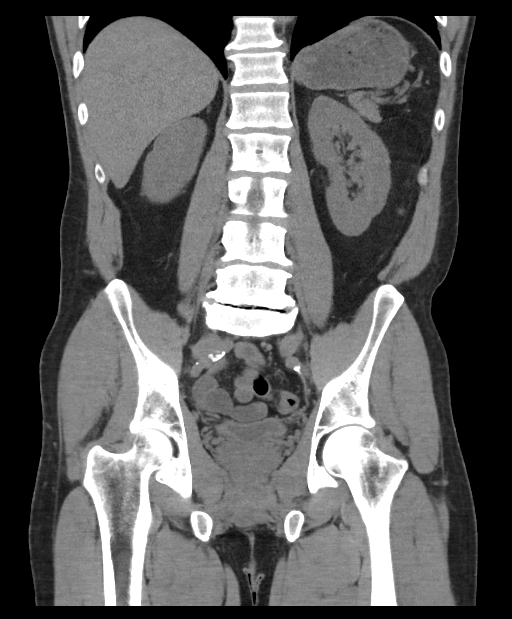

[15 of 46 positions shown; findings below may reference images not displayed]

FINDINGS: Lower chest: Scarring/subsegmental atelectasis lung bases. Heart
size within normal limits.

Hepatobiliary: Taking into account limitation by non contrast
imaging, no worrisome hepatic lesion. No calcified gallstone.

Pancreas: Taking into account limitation by non contrast imaging, no
mass or inflammation.

Spleen: Taking into account limitation by non contrast imaging, no
mass or enlargement. Atypical contour unchanged.

Adrenals/Urinary Tract: No renal or ureteral obstructing stone or
evidence hydronephrosis. Right renal 1.6 cm cyst. Taking into
account limitation by non contrast imaging, no obvious worrisome
renal or adrenal mass.

1.2 cm lobulated lesion within the bladder base. Question primary
bladder mass. Result of prostate gland enlargement felt to be a
secondary consideration.

Stomach/Bowel: No extraluminal bowel inflammatory process, free
fluid or free air. Evaluation of bowel is limited secondary to
underdistention. Circumferential thickening rectosigmoid region may
be related to underdistention rather than inflammation.

Vascular/Lymphatic: Atherosclerotic changes aorta and iliac arteries
with minimal ectasia without aneurysm. No adenopathy.

Reproductive: Prostate gland slightly prominent and minimally
lobular. Clinical and laboratory correlation recommended.

Other: No bowel containing hernia.

Musculoskeletal: Mild L4-5 and moderate to marked L5-S1 disc
degeneration with bulge/gas containing protrusion L5-S1 level
greater to left.
IMPRESSION: No renal or ureteral obstructing stone or evidence hydronephrosis.
Right renal 1.6 cm cyst.

1.2 cm lobulated lesion within the bladder base. Question primary
bladder mass. Result of prostate gland enlargement felt to be a
secondary consideration.

Prostate gland slightly prominent and minimally lobular. Clinical
and laboratory correlation recommended.

Aortic atherosclerosis.

Evaluation of bowel is limited secondary to underdistention.
Circumferential thickening rectosigmoid region may be related to
underdistention rather than inflammation.

Moderate to marked L5-S1 disc degeneration with bulge/gas containing
protrusion greater to left.
